# Patient Record
Sex: Male | Born: 1995 | Race: White | Hispanic: No | Marital: Married | Smoking: Never smoker
Health system: Southern US, Community
[De-identification: ages and names within clinical notes are randomized; demographics above are authoritative.]

---
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
## 2016-03-08 ENCOUNTER — Emergency Department (HOSPITAL_COMMUNITY): Payer: BLUE CROSS/BLUE SHIELD

## 2016-03-08 ENCOUNTER — Encounter (HOSPITAL_COMMUNITY): Payer: Self-pay | Admitting: Emergency Medicine

## 2016-03-08 ENCOUNTER — Emergency Department (HOSPITAL_COMMUNITY)
Admission: EM | Admit: 2016-03-08 | Discharge: 2016-03-08 | Disposition: A | Discharge status: Home / Self Care | Payer: BLUE CROSS/BLUE SHIELD | Attending: Emergency Medicine | Admitting: Emergency Medicine

## 2016-03-08 DIAGNOSIS — R05 Cough: Secondary | ICD-10-CM | POA: Diagnosis not present

## 2016-03-08 DIAGNOSIS — J111 Influenza due to unidentified influenza virus with other respiratory manifestations: Secondary | ICD-10-CM

## 2016-03-08 DIAGNOSIS — J029 Acute pharyngitis, unspecified: Secondary | ICD-10-CM | POA: Insufficient documentation

## 2016-03-08 DIAGNOSIS — R112 Nausea with vomiting, unspecified: Secondary | ICD-10-CM | POA: Diagnosis present

## 2016-03-08 DIAGNOSIS — R69 Illness, unspecified: Secondary | ICD-10-CM

## 2016-03-08 LAB — CBC WITH DIFFERENTIAL/PLATELET
BASOS PCT: 0 %
Basophils Absolute: 0 10*3/uL (ref 0.0–0.1)
Eosinophils Absolute: 0 10*3/uL (ref 0.0–0.7)
Eosinophils Relative: 0 %
HEMATOCRIT: 41.3 % (ref 39.0–52.0)
Hemoglobin: 14.5 g/dL (ref 13.0–17.0)
LYMPHS PCT: 7 %
Lymphs Abs: 0.2 10*3/uL — ABNORMAL LOW (ref 0.7–4.0)
MCH: 31.2 pg (ref 26.0–34.0)
MCHC: 35.1 g/dL (ref 30.0–36.0)
MCV: 88.8 fL (ref 78.0–100.0)
MONO ABS: 0.7 10*3/uL (ref 0.1–1.0)
MONOS PCT: 20 %
NEUTROS ABS: 2.6 10*3/uL (ref 1.7–7.7)
Neutrophils Relative %: 73 %
Platelets: 222 10*3/uL (ref 150–400)
RBC: 4.65 MIL/uL (ref 4.22–5.81)
RDW: 12.4 % (ref 11.5–15.5)
WBC: 3.5 10*3/uL — ABNORMAL LOW (ref 4.0–10.5)

## 2016-03-08 LAB — URINALYSIS, ROUTINE W REFLEX MICROSCOPIC
Bilirubin Urine: NEGATIVE
Glucose, UA: NEGATIVE mg/dL
HGB URINE DIPSTICK: NEGATIVE
Ketones, ur: 5 mg/dL — AB
LEUKOCYTES UA: NEGATIVE
NITRITE: NEGATIVE
PROTEIN: 100 mg/dL — AB
Specific Gravity, Urine: 1.032 — ABNORMAL HIGH (ref 1.005–1.030)
pH: 5 (ref 5.0–8.0)

## 2016-03-08 LAB — BASIC METABOLIC PANEL
Anion gap: 10 (ref 5–15)
BUN: 12 mg/dL (ref 6–20)
CHLORIDE: 103 mmol/L (ref 101–111)
CO2: 25 mmol/L (ref 22–32)
CREATININE: 1.11 mg/dL (ref 0.61–1.24)
Calcium: 9.6 mg/dL (ref 8.9–10.3)
GFR calc non Af Amer: >60 mL/min (ref >60)
Glucose, Bld: 105 mg/dL — ABNORMAL HIGH (ref 65–99)
Potassium: 4 mmol/L (ref 3.5–5.1)
Sodium: 138 mmol/L (ref 135–145)

## 2016-03-08 LAB — I-STAT CG4 LACTIC ACID, ED: Lactic Acid, Venous: 0.84 mmol/L (ref 0.5–1.9)

## 2016-03-08 MED ORDER — ONDANSETRON HCL 4 MG/2ML IJ SOLN
4.0000 mg | Freq: Once | INTRAMUSCULAR | Status: AC
  Administered 2016-03-08: 4 mg via INTRAVENOUS
  Filled 2016-03-08: qty 2

## 2016-03-08 MED ORDER — ONDANSETRON HCL 4 MG PO TABS: 4.0000 mg | ORAL_TABLET | Freq: Three times a day (TID) | ORAL | 0 refills | Status: DC | PRN

## 2016-03-08 MED ORDER — ACETAMINOPHEN 325 MG PO TABS: 650.0000 mg | ORAL_TABLET | Freq: Once | ORAL | Status: DC

## 2016-03-08 MED ORDER — HYDROCODONE-ACETAMINOPHEN 5-325 MG PO TABS: ORAL_TABLET | ORAL | 0 refills | Status: DC

## 2016-03-08 MED ORDER — SODIUM CHLORIDE 0.9 % IV BOLUS (SEPSIS)
2000.0000 mL | Freq: Once | INTRAVENOUS | Status: AC
  Administered 2016-03-08: 2000 mL via INTRAVENOUS

## 2016-03-08 MED ORDER — KETOROLAC TROMETHAMINE 30 MG/ML IJ SOLN
30.0000 mg | Freq: Once | INTRAMUSCULAR | Status: AC
  Administered 2016-03-08: 30 mg via INTRAVENOUS
  Filled 2016-03-08: qty 1

## 2016-03-08 NOTE — ED Notes (Signed)
Papers and medications reviewed with patient along with home care instructions. PA was present to review and answer any questions

## 2016-03-08 NOTE — ED Provider Notes (Signed)
[facility]-EMERGENCY DEPT Provider Note   CSN: [id number] Arrival date & time: 03/08/16  0206     History   Chief Complaint Chief Complaint  Patient presents with  . Emesis     HPI   Blood pressure 120/72, pulse 119, temperature 100.3 F (37.9 C), temperature source Oral, resp. rate 19, height 5\' 8"  (1.727 m), weight 68.9 kg, SpO2 100 %.  Bradley Shaffer is a 21 y.o. male was otherwise healthy complaining of flulike symptoms of sore throat, cough, fever, chills, myalgia, headache onset 3 days ago. He started Tamiflu yesterday and then proceeded to have 20 episodes of nonbloody, nonbilious, non-coffee ground emesis. He denies focal abdominal pain, change in bowel or bladder habits, chest pain or shortness of breath.  History reviewed. No pertinent past medical history.  There are no active problems to display for this patient.   History reviewed. No pertinent surgical history.     Home Medications    Prior to Admission medications   Medication Sig Start Date End Date Taking? Authorizing Provider  acetaminophen (TYLENOL) 325 MG tablet Take 1,300 mg by mouth every 6 (six) hours as needed for moderate pain.   Yes Historical Provider, MD  brompheniramine-pseudoephedrine-DM 30-2-10 MG/5ML syrup Take 10 mLs by mouth every 6 (six) hours. 03/07/16  Yes Historical Provider, MD  dimenhyDRINATE (DRAMAMINE) 50 MG tablet Take 150 mg by mouth every 8 (eight) hours as needed for nausea.   Yes Historical Provider, MD  naproxen sodium (ANAPROX) 220 MG tablet Take 440 mg by mouth 2 (two) times daily with a meal.   Yes Historical Provider, MD  oseltamivir (TAMIFLU) 75 MG capsule Take 75 mg by mouth daily. 03/07/16 03/12/16 Yes Historical Provider, MD  HYDROcodone-acetaminophen (NORCO/VICODIN) 5-325 MG tablet Take 1-2 tablets by mouth every 6 hours as needed for pain and/or cough. 03/08/16   Jerline Linzy, PA-C  ondansetron (ZOFRAN) 4 MG tablet Take 1 tablet (4 mg total) by mouth every 8 (eight) hours as  needed for nausea or vomiting. 03/08/16   Joni Reining Mischell Branford, PA-C    Family History No family history on file.  Social History Social History  Substance Use Topics  . Smoking status: Never Smoker  . Smokeless tobacco: Never Used  . Alcohol use No     Allergies   Patient has no known allergies.   Review of Systems Review of Systems  10 systems reviewed and found to be negative, except as noted in the HPI.   Physical Exam Updated Vital Signs BP 113/77   Pulse 85   Temp 99.3 F (37.4 C) (Oral)   Resp 25   Ht 5\' 8"  (1.727 m)   Wt 68.9 kg   SpO2 98%   BMI 23.11 kg/m   Physical Exam  Constitutional: He is oriented to person, place, and time. He appears well-developed and well-nourished. No distress.  Alert, nontoxic appearing  HENT:  Head: Normocephalic and atraumatic.  Mouth/Throat: Oropharynx is clear and moist.  Eyes: Conjunctivae and EOM are normal. Pupils are equal, round, and reactive to light.  Neck: Normal range of motion.  FROM to C-spine. Pt can touch chin to chest without discomfort. No TTP of midline cervical spine.   Cardiovascular: Regular rhythm and intact distal pulses.   Tachycardic with no murmurs, regular rhythm.  Pulmonary/Chest: Effort normal and breath sounds normal. No respiratory distress. He has no wheezes. He has no rales. He exhibits no tenderness.  Abdominal: Soft. There is no tenderness.  Musculoskeletal: Normal range of motion.  Neurological: He is alert and oriented to person, place, and time.  Skin: He is not diaphoretic.  Psychiatric: He has a normal mood and affect.  Nursing note and vitals reviewed.    ED Treatments / Results  Labs (all labs ordered are listed, but only abnormal results are displayed) Labs Reviewed  CBC WITH DIFFERENTIAL/PLATELET - Abnormal; Notable for the following:       Result Value   WBC 3.5 (*)    Lymphs Abs 0.2 (*)    All other components within normal limits  BASIC METABOLIC PANEL - Abnormal;  Notable for the following:    Glucose, Bld 105 (*)    All other components within normal limits  URINALYSIS, ROUTINE W REFLEX MICROSCOPIC - Abnormal; Notable for the following:    APPearance HAZY (*)    Specific Gravity, Urine 1.032 (*)    Ketones, ur 5 (*)    Protein, ur 100 (*)    Bacteria, UA RARE (*)    Squamous Epithelial / LPF 0-5 (*)    All other components within normal limits  CULTURE, BLOOD (ROUTINE X 2)  CULTURE, BLOOD (ROUTINE X 2)  I-STAT CG4 LACTIC ACID, ED  I-STAT CG4 LACTIC ACID, ED    EKG  EKG Interpretation None       Radiology Dg Chest 2 View  Result Date: 03/08/2016 CLINICAL DATA:  Cough, fever, chills, and body aches for the past 3 days. EXAM: CHEST  2 VIEW COMPARISON:  None in PACs FINDINGS: The lungs are adequately inflated and clear. The heart and pulmonary vascularity are normal. The mediastinum is normal in width. The trachea is midline. The bony thorax is unremarkable. IMPRESSION: There is no active cardiopulmonary disease. Electronically Signed   By: David  [place] M.D.   On: 03/08/2016 07:41    Procedures Procedures (including critical care time)  Medications Ordered in ED Medications  sodium chloride 0.9 % bolus 2,000 mL (2,000 mLs Intravenous New Bag/Given 03/08/16 0842)  ondansetron (ZOFRAN) injection 4 mg (4 mg Intravenous Given 03/08/16 0845)  ketorolac (TORADOL) 30 MG/ML injection 30 mg (30 mg Intravenous Given 03/08/16 0846)     Initial Impression / Assessment and Plan / ED Course  I have reviewed the triage vital signs and the nursing notes.  Pertinent labs & imaging results that were available during my care of the patient were reviewed by me and considered in my medical decision making (see chart for details).  Clinical Course as of Mar 08 1020  Tue Mar 08, 2016  0719 Pt not roomed  [NP]  0727 Pt not roomed  [NP]    Clinical Course User Index [NP] Wynetta Emery, [place]    Vitals:   03/08/16 0801 03/08/16 0830 03/08/16 0900  03/08/16 0914  BP: 107/65 113/70 113/77   Pulse: 97 91 85   Resp: 20 25 25    Temp: 99.7 F (37.6 C)   99.3 F (37.4 C)  TempSrc: Oral   Oral  SpO2: 98% 96% 98%   Weight:      Height:        Medications  sodium chloride 0.9 % bolus 2,000 mL (2,000 mLs Intravenous New Bag/Given 03/08/16 0842)  ondansetron (ZOFRAN) injection 4 mg (4 mg Intravenous Given 03/08/16 0845)  ketorolac (TORADOL) 30 MG/ML injection 30 mg (30 mg Intravenous Given 03/08/16 0846)    Justus Memory is 21 y.o. male presenting with  Influenza-like illness and then he started vomiting after initiating Tamiflu likely adverse reaction of the Tamiflu. Abdominal  exam is benign. Blood work with no leukocytosis, he is tachycardic. His lactic acid is normal, likely dehydration. Patient is aggressively hydrated and heart rate has normalized, chest x-rays without infiltrate, repeat abdominal exam is benign, patient is tolerating by mouth's, advised him to DC the Tamiflu and will write a short course of Zofran and Vicodin for comfort at home. Work note is provided.  Evaluation does not show pathology that would require ongoing emergent intervention or inpatient treatment. Pt is hemodynamically stable and mentating appropriately. Discussed findings and plan with patient/guardian, who agrees with care plan. All questions answered. Return precautions discussed and outpatient follow up given.   Final Clinical Impressions(s) / ED Diagnoses   Final diagnoses:  Influenza-like illness  Nausea and vomiting, intractability of vomiting not specified, unspecified vomiting type    New Prescriptions New Prescriptions   HYDROCODONE-ACETAMINOPHEN (NORCO/VICODIN) 5-325 MG TABLET    Take 1-2 tablets by mouth every 6 hours as needed for pain and/or cough.   ONDANSETRON (ZOFRAN) 4 MG TABLET    Take 1 tablet (4 mg total) by mouth every 8 (eight) hours as needed for nausea or vomiting.     Wynetta Emeryicole Delbra Zellars, PA-C 03/08/16 1022    Lavera Guiseana Duo Liu,  MD 03/08/16 1725

## 2016-03-08 NOTE — ED Triage Notes (Signed)
Patient reports emesis with body aches , chills , dry cough  and low grade fever onset yesterday unrelieved by prescription Tamiflu .

## 2016-03-08 NOTE — Discharge Instructions (Signed)
Push fluids: take small frequent sips of water or Gatorade, do not drink any soda, juice or caffeinated beverages.    Slowly resume solid diet as desired. Avoid food that are spicy, contain dairy and/or have high fat content.  Aviod NSAIDs (aspirin, motrin, ibuprofen, naproxen, Aleve et Karie Sodacetera) for pain control because they will irritate your stomach.  For fever control you can take 650 mg of acetaminophen (Tylenol) this is normally 2 over the counter pills; every 4-6 hours, do not take any other medications that have acetaminophen as an ingredient. Do not drink alcohol.  Return to the emergency room for any worsening or concerning symptoms including fast breathing, heart racing, confusion, vomiting.  Rest, cover your mouth when you cough and wash your hands frequently.   Push fluids: water or Gatorade, do not drink any soda, juice or caffeinated beverages.  For fever and pain control you can take Motrin (ibuprofen) as follows: 400 mg (this is normally 2 over the counter pills) every 4 hours with food.  Do not return to work until a day after your fever breaks.   Take Vicodin for cough and pain control, do not drink alcohol, drive, care for children or do other critical tasks while taking Vicodin

## 2016-03-08 NOTE — ED Notes (Signed)
Pt has been drinking ginger ale since arrival.

## 2016-03-13 LAB — CULTURE, BLOOD (ROUTINE X 2)
Culture: NO GROWTH
Culture: NO GROWTH

## 2017-04-03 ENCOUNTER — Emergency Department (HOSPITAL_BASED_OUTPATIENT_CLINIC_OR_DEPARTMENT_OTHER)
Admission: EM | Admit: 2017-04-03 | Discharge: 2017-04-03 | Disposition: A | Discharge status: Home / Self Care | Payer: 59 | Attending: Emergency Medicine | Admitting: Emergency Medicine

## 2017-04-03 ENCOUNTER — Encounter (HOSPITAL_BASED_OUTPATIENT_CLINIC_OR_DEPARTMENT_OTHER): Payer: Self-pay | Admitting: *Deleted

## 2017-04-03 ENCOUNTER — Other Ambulatory Visit: Payer: Self-pay

## 2017-04-03 DIAGNOSIS — R69 Illness, unspecified: Secondary | ICD-10-CM

## 2017-04-03 DIAGNOSIS — R070 Pain in throat: Secondary | ICD-10-CM | POA: Diagnosis present

## 2017-04-03 DIAGNOSIS — J111 Influenza due to unidentified influenza virus with other respiratory manifestations: Secondary | ICD-10-CM | POA: Diagnosis not present

## 2017-04-03 DIAGNOSIS — Z79899 Other long term (current) drug therapy: Secondary | ICD-10-CM | POA: Diagnosis not present

## 2017-04-03 NOTE — ED Triage Notes (Signed)
Pt c/o URi symptoms x 2 days 

## 2017-04-03 NOTE — ED Notes (Signed)
ED Provider at bedside. 

## 2017-04-03 NOTE — ED Provider Notes (Signed)
[facility] EMERGENCY DEPARTMENT Provider Note   CSN: [id number] Arrival date & time: 04/03/17  1255     History   Chief Complaint Chief Complaint  Patient presents with  . URI    HPI Bradley Shaffer is a 22 y.o. male.  Patient with flulike symptoms for 2 days.  Sore throat cough chills body aches onset of symptoms was yesterday.  No nausea has felt somewhat feverish cough usually nonproductive body aches sore throat as mentioned.  Did not have the flu vaccine.  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  No nausea no vomiting.  No diarrhea.  No rash.      History reviewed. No pertinent past medical history.  There are no active problems to display for this patient.   History reviewed. No pertinent surgical history.     Home Medications    Prior to Admission medications   Medication Sig Start Date End Date Taking? Authorizing Provider  acetaminophen (TYLENOL) 325 MG tablet Take 1,300 mg by mouth every 6 (six) hours as needed for moderate pain.    [provider]  brompheniramine-pseudoephedrine-DM 30-2-10 MG/5ML syrup Take 10 mLs by mouth every 6 (six) hours. 03/07/16   [provider]  dimenhyDRINATE (DRAMAMINE) 50 MG tablet Take 150 mg by mouth every 8 (eight) hours as needed for nausea.    [provider]  naproxen sodium (ANAPROX) 220 MG tablet Take 440 mg by mouth 2 (two) times daily with a meal.    [provider]  ondansetron (ZOFRAN) 4 MG tablet Take 1 tablet (4 mg total) by mouth every 8 (eight) hours as needed for nausea or vomiting. 03/08/16   Pisciotta, Mardella LaymanNicole, PA-C    Family History History reviewed. No pertinent family history.  Social History Social History   Tobacco Use  . Smoking status: Never Smoker  . Smokeless tobacco: Never Used  Substance Use Topics  . Alcohol use: No  . Drug use: No     Allergies   Patient has no known allergies.   Review of Systems Review of Systems  Constitutional: Positive for fever.   HENT: Positive for congestion and sore throat.   Eyes: Negative for redness.  Respiratory: Positive for cough. Negative for shortness of breath.   Cardiovascular: Negative for chest pain.  Gastrointestinal: Negative for abdominal pain, diarrhea, nausea and vomiting.  Genitourinary: Negative for dysuria and hematuria.  Musculoskeletal: Positive for myalgias. Negative for neck stiffness.  Skin: Negative for rash.  Neurological: Negative for syncope and headaches.  Hematological: Does not bruise/bleed easily.  Psychiatric/Behavioral: Negative for confusion.     Physical Exam Updated Vital Signs Ht 1.753 m (5\' 9" )   Wt 74.8 kg (165 lb)   BMI 24.37 kg/m   Physical Exam  Constitutional: He is oriented to person, place, and time. He appears well-developed and well-nourished. No distress.  HENT:  Head: Normocephalic and atraumatic.  Mouth/Throat: Oropharynx is clear and moist. No oropharyngeal exudate.  Eyes: Conjunctivae and EOM are normal. Pupils are equal, round, and reactive to light.  Neck: Normal range of motion. Neck supple.  Cardiovascular: Normal rate, regular rhythm and normal heart sounds.  Pulmonary/Chest: Effort normal and breath sounds normal. No respiratory distress.  Abdominal: Soft. Bowel sounds are normal. There is no tenderness.  Musculoskeletal: Normal range of motion. He exhibits no edema.  Lymphadenopathy:    He has no cervical adenopathy.  Neurological: He is alert and oriented to person, place, and time. No cranial nerve deficit or sensory deficit.  He exhibits normal muscle tone. Coordination normal.  Skin: Skin is warm. No rash noted.  Nursing note and vitals reviewed.    ED Treatments / Results  Labs (all labs ordered are listed, but only abnormal results are displayed) Labs Reviewed - No data to display  EKG  EKG Interpretation None       Radiology No results found.  Procedures Procedures (including critical care time)  Medications  Ordered in ED Medications - No data to display   Initial Impression / Assessment and Plan / ED Course  I have reviewed the triage vital signs and the nursing notes.  Pertinent labs & imaging results that were available during my care of the patient were reviewed by me and considered in my medical decision making (see chart for details).    Patient's symptoms flulike in nature.  Posterior pharynx without any erythema or tonsillar exudate or swelling.  Doubt that this is strep throat.  Patient did not have the flu vaccine.  Patient is young enough where mononucleosis is a possibility.  However symptoms just started yesterday so unlikely Monospot test would be positive.  Will treat like it is flu.  Offered Tamiflu patient does not want Tamiflu.  He took that in the past and has made him nauseated.  Patient will be taken out of work for 7 days.  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  Work note provided for 7 days.  Recommending symptomatic treatment Mucinex DM and Naprosyn.   Final Clinical Impressions(s) / ED Diagnoses   Final diagnoses:  Influenza-like illness    ED Discharge Orders    None       Vanetta Mulders, MD 04/04/17 0005

## 2017-04-03 NOTE — Discharge Instructions (Signed)
Symptoms consistent with flulike illness without any complicating factors.  Work note provided.  Return for any new or worse symptoms.  Recommend symptomatic treatment.

## 2018-04-23 IMAGING — DX DG CHEST 2V
2 series · 2 of 2 positions shown · non-contrast
Comparison: None in PACs

CLINICAL DATA: Cough, fever, chills, and body aches for the past 3
days.

EXAM:
CHEST  2 VIEW

[chest pa]
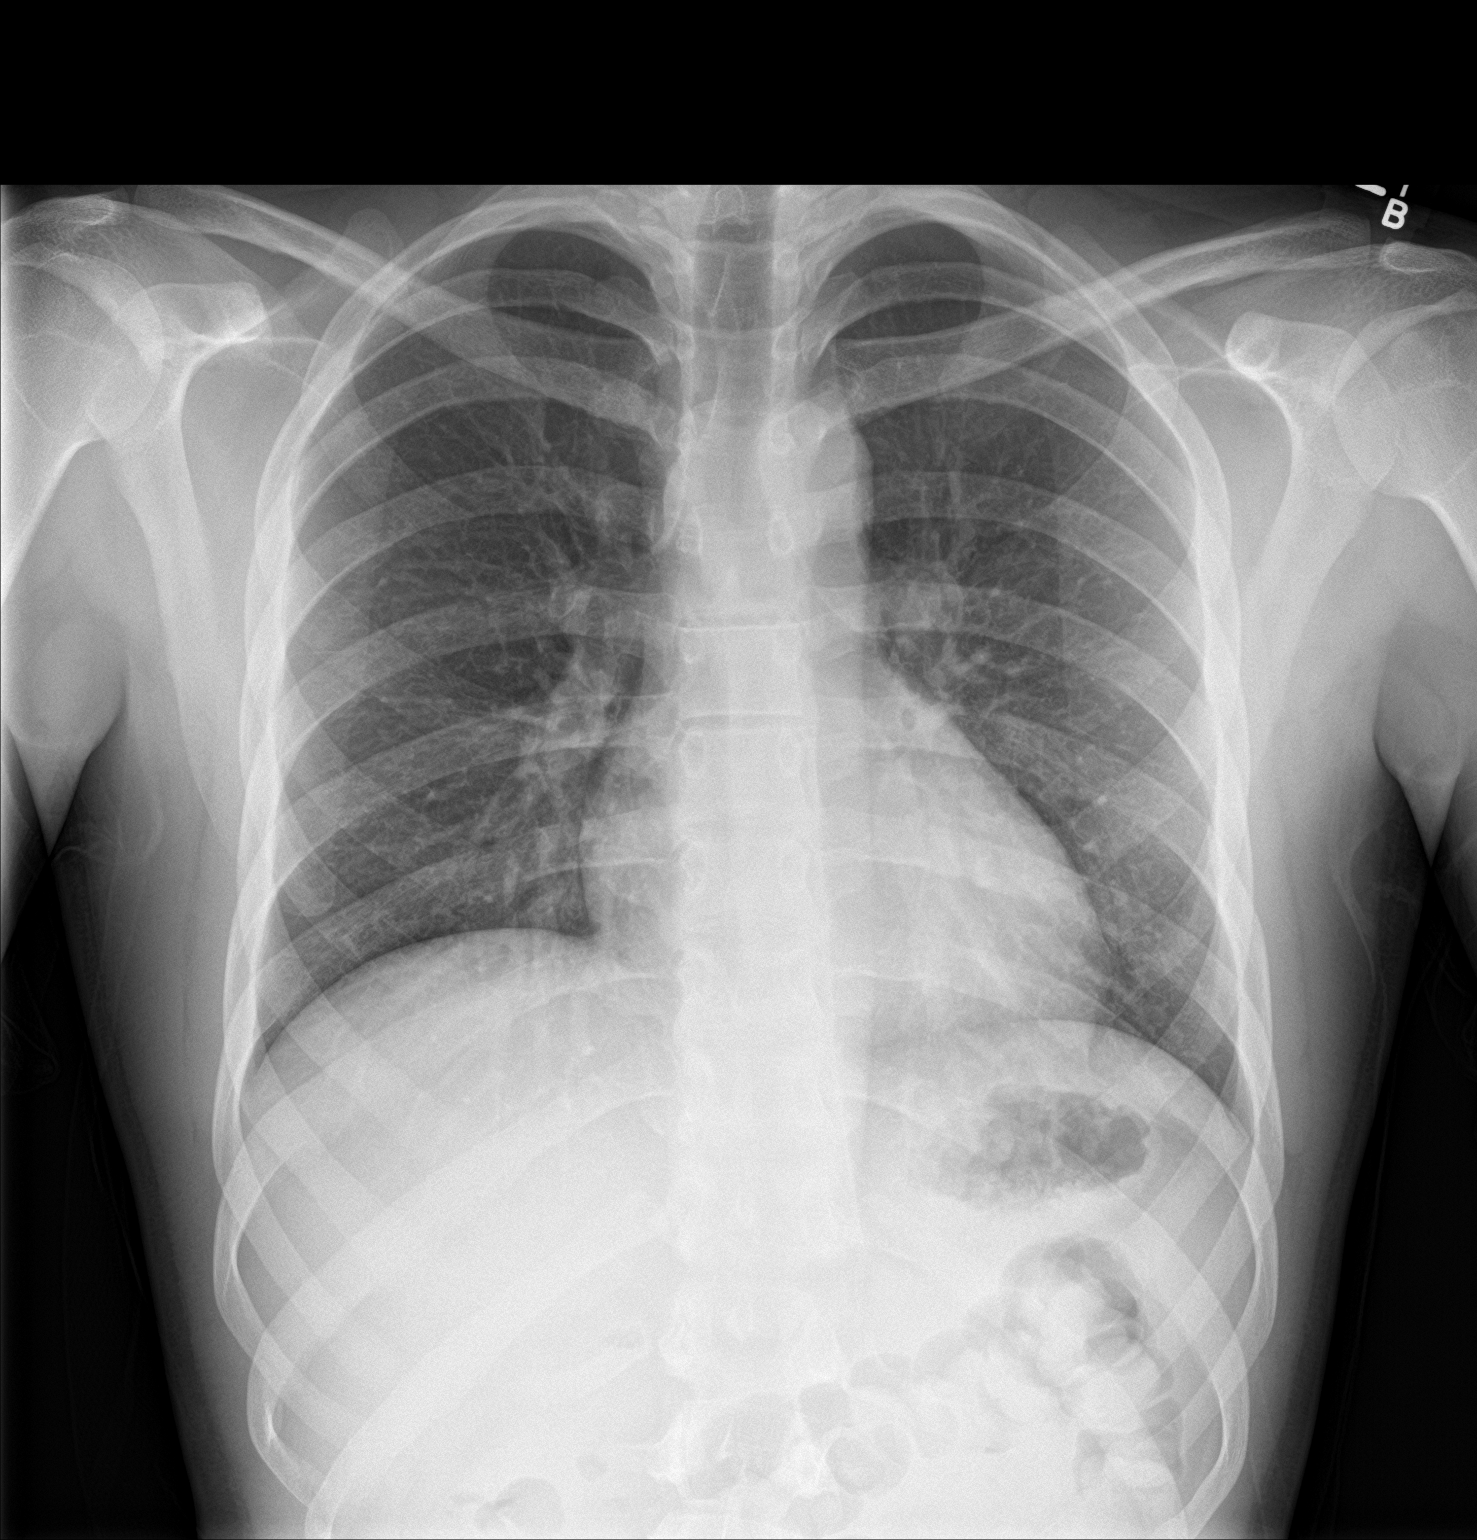

[chest lat]
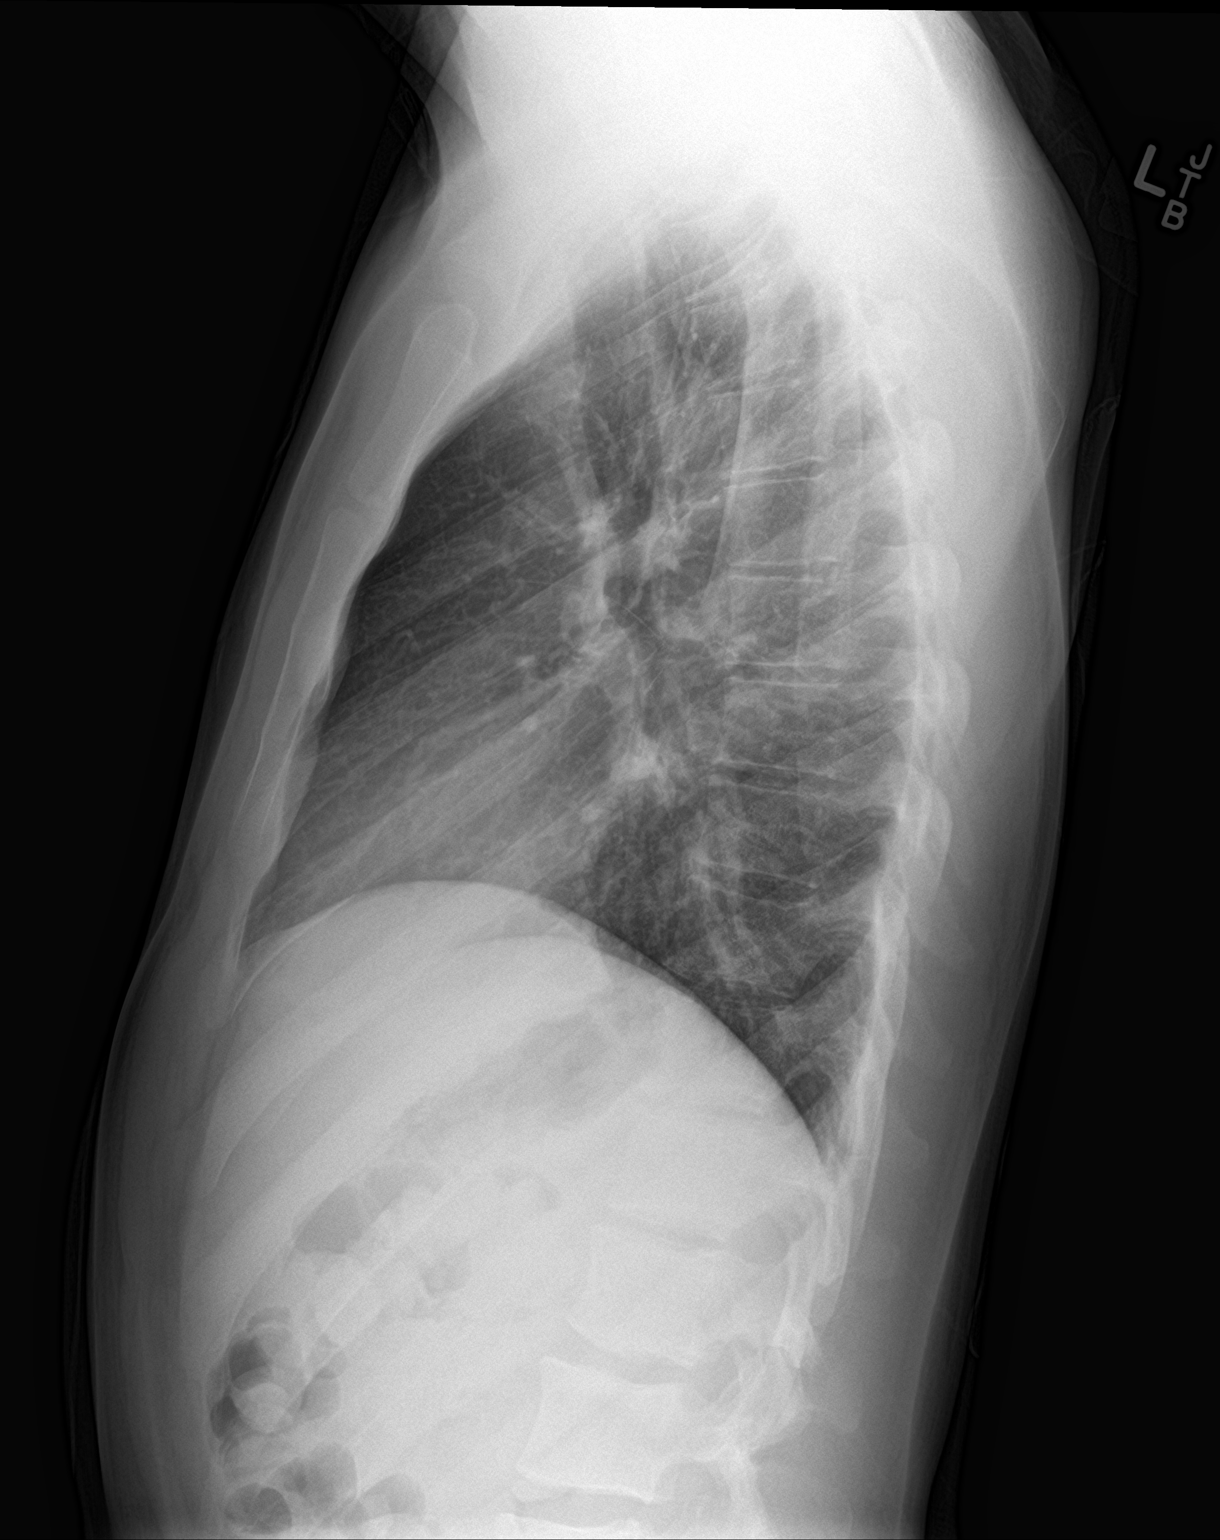

[2 of 2 positions shown; findings below may reference images not displayed]

FINDINGS: The lungs are adequately inflated and clear. The heart and pulmonary
vascularity are normal. The mediastinum is normal in width. The
trachea is midline. The bony thorax is unremarkable.
IMPRESSION: There is no active cardiopulmonary disease.

## 2020-03-18 ENCOUNTER — Telehealth: Payer: Self-pay | Admitting: Sports Medicine

## 2020-03-18 ENCOUNTER — Encounter: Payer: Self-pay | Admitting: Sports Medicine

## 2020-03-18 DIAGNOSIS — U071 COVID-19: Secondary | ICD-10-CM | POA: Insufficient documentation

## 2020-03-18 MED ORDER — PREDNISONE 50 MG PO TABS: ORAL_TABLET | ORAL | 0 refills | Status: DC

## 2020-03-18 NOTE — Telephone Encounter (Signed)
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 Adding 5 days of of prednisone. Adding salt water gargles. He will continue 10 days of quarantine.

## 2020-03-18 NOTE — Assessment & Plan Note (Signed)
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 Adding 5 days of of prednisone. He will continue 10 days of quarantine.

## 2020-07-01 ENCOUNTER — Telehealth: Payer: Self-pay | Admitting: Sports Medicine

## 2020-07-01 DIAGNOSIS — K120 Recurrent oral aphthae: Secondary | ICD-10-CM | POA: Insufficient documentation

## 2020-07-01 MED ORDER — TRIAMCINOLONE ACETONIDE 0.1 % MT PSTE: 1.0000 "application " | PASTE | Freq: Two times a day (BID) | OROMUCOSAL | 11 refills | Status: AC

## 2020-07-01 NOTE — Telephone Encounter (Signed)
Bradley Shaffer has developed what looks to be aphthous ulcers, very painful, difficult to speak and eat.  Tried ibuprofen 800mg PO TID without improvement.  Adding triamcinolone oral paste. 

## 2020-07-01 NOTE — Assessment & Plan Note (Signed)
Bradley Shaffer has developed what looks to be aphthous ulcers, very painful, difficult to speak and eat.  Tried ibuprofen 800mg  PO TID without improvement.  Adding triamcinolone oral paste.

## 2020-10-02 ENCOUNTER — Telehealth: Payer: Self-pay | Admitting: Sports Medicine

## 2020-10-02 DIAGNOSIS — U071 COVID-19: Secondary | ICD-10-CM

## 2020-10-02 MED ORDER — ONDANSETRON 8 MG PO TBDP: 8.0000 mg | ORAL_TABLET | Freq: Three times a day (TID) | ORAL | 3 refills | Status: DC | PRN

## 2020-10-02 MED ORDER — PAXLOVID 20 X 150 MG & 10 X 100MG PO TBPK: 3.0000 | ORAL_TABLET | Freq: Two times a day (BID) | ORAL | 2 refills | Status: AC

## 2020-10-02 NOTE — Telephone Encounter (Signed)
Previously healthy 25 year old male, new onset headache, chills without fever, indigestion, muscle aches and body aches.  He has history of COVID prior, symptoms feel similar. Due to recency of symptom onset we will treat empirically with Paxlovid, he will get over-the-counter testing, he is having some nausea so adding Zofran, he will use over-the-counter cold and flu medications for symptomatic relief in the meantime and follow-up with me in approximately 5 to 10 days.

## 2020-10-02 NOTE — Assessment & Plan Note (Signed)
Previously healthy 24-year-old male, new onset headache, chills without fever, indigestion, muscle aches and body aches.  He has history of COVID prior, symptoms feel similar. Due to recency of symptom onset we will treat empirically with Paxlovid, he will get over-the-counter testing, he is having some nausea so adding Zofran, he will use over-the-counter cold and flu medications for symptomatic relief in the meantime and follow-up with me in approximately 5 to 10 days. 

## 2020-10-27 ENCOUNTER — Other Ambulatory Visit: Payer: Self-pay

## 2020-10-27 ENCOUNTER — Encounter: Payer: Self-pay | Admitting: Sports Medicine

## 2020-10-27 ENCOUNTER — Ambulatory Visit (INDEPENDENT_AMBULATORY_CARE_PROVIDER_SITE_OTHER): Payer: 59 | Admitting: Sports Medicine

## 2020-10-27 DIAGNOSIS — Z0000 Encounter for general adult medical examination without abnormal findings: Secondary | ICD-10-CM | POA: Insufficient documentation

## 2020-10-27 DIAGNOSIS — H9190 Unspecified hearing loss, unspecified ear: Secondary | ICD-10-CM | POA: Insufficient documentation

## 2020-10-27 DIAGNOSIS — H9193 Unspecified hearing loss, bilateral: Secondary | ICD-10-CM

## 2020-10-27 NOTE — Assessment & Plan Note (Signed)
Erma will return at some point for annual physical, we will discuss later routine labs.

## 2020-10-27 NOTE — Assessment & Plan Note (Addendum)
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 Exam is unremarkable, normal Rinne and Weber tests, normal inspection of the external canals, he does have a bit of sclerosis of his tympanic membranes bilaterally. Hearing test does show loss of hearing in all frequencies below 25 dB. This is likely due to prolonged exposure to loud sounds, my advice to him is to protect his hearing while on the ramp, and try turning off active noise canceling what is headsets to see if that helps. I do not think symptomatology is enough at this juncture to warrant hearing aids.

## 2020-10-27 NOTE — Progress Notes (Addendum)
    Procedures performed today:    None.  Independent interpretation of notes and tests performed by another provider:   None.  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 Exam is unremarkable, normal Rinne and Weber tests, normal inspection of the external canals, he does have a bit of sclerosis of his tympanic membranes bilaterally. Hearing test does show loss of hearing in all frequencies below 25 dB. This is likely due to prolonged exposure to loud sounds, my advice to him is to protect his hearing while on the ramp, and try turning off active noise canceling what is headsets to see if that helps. I do not think symptomatology is enough at this juncture to warrant hearing aids.  Annual physical exam Donn will return at some point for annual physical, we will discuss later routine labs.    ___________________________________________ Ihor Johney. Benjamin Stain, M.D., ABFM., CAQSM. Primary Care and Sports Medicine [facility]  Adjunct Instructor of Family Medicine  [employer]

## 2020-11-28 ENCOUNTER — Encounter: Payer: Self-pay | Admitting: Sports Medicine

## 2021-01-12 ENCOUNTER — Telehealth: Payer: Self-pay | Admitting: Sports Medicine

## 2021-01-12 DIAGNOSIS — H00019 Hordeolum externum unspecified eye, unspecified eyelid: Secondary | ICD-10-CM | POA: Insufficient documentation

## 2021-01-12 DIAGNOSIS — H00015 Hordeolum externum left lower eyelid: Secondary | ICD-10-CM

## 2021-01-12 MED ORDER — ERYTHROMYCIN 5 MG/GM OP OINT: 1.0000 "application " | TOPICAL_OINTMENT | Freq: Three times a day (TID) | OPHTHALMIC | 0 refills | Status: AC

## 2021-01-12 NOTE — Telephone Encounter (Signed)
Stye, left lower eyelid Adding topical erythromycin, patient understands not to fly within an hour or 2 of applying the antibiotic.

## 2021-01-12 NOTE — Assessment & Plan Note (Signed)
Adding topical erythromycin, patient understands not to fly within an hour or 2 of applying the antibiotic.

## 2021-02-15 ENCOUNTER — Telehealth: Payer: Self-pay | Admitting: Sports Medicine

## 2021-02-15 DIAGNOSIS — U071 COVID-19: Secondary | ICD-10-CM

## 2021-02-15 DIAGNOSIS — J111 Influenza due to unidentified influenza virus with other respiratory manifestations: Secondary | ICD-10-CM

## 2021-02-15 DIAGNOSIS — T391X1A Poisoning by 4-Aminophenol derivatives, accidental (unintentional), initial encounter: Secondary | ICD-10-CM

## 2021-02-15 DIAGNOSIS — J209 Acute bronchitis, unspecified: Secondary | ICD-10-CM | POA: Insufficient documentation

## 2021-02-15 MED ORDER — ONDANSETRON 8 MG PO TBDP: 8.0000 mg | ORAL_TABLET | Freq: Three times a day (TID) | ORAL | 3 refills | Status: DC | PRN

## 2021-02-15 MED ORDER — XOFLUZA (80 MG DOSE) 1 X 80 MG PO TBPK: 1.0000 | ORAL_TABLET | Freq: Once | ORAL | 0 refills | Status: AC

## 2021-02-15 NOTE — Telephone Encounter (Deleted)
Influenza-like illness Bradley Shaffer has had about a day or 2 of muscle aches, body aches, chills, wife is sick as well, he had excessive nausea with Tamiflu, I think I have a Xofluza sample in my drawer that I will leave at the front for him to pick up.  Symptomatic management until then.

## 2021-02-15 NOTE — Assessment & Plan Note (Signed)
Bradley Shaffer has had about a day or 2 of muscle aches, body aches, chills, wife is sick as well, he had excessive nausea with Tamiflu, I think I have a Xofluza sample in my drawer that I will leave at the front for him to pick up.  Symptomatic management until then.

## 2021-02-16 DIAGNOSIS — T391X1A Poisoning by 4-Aminophenol derivatives, accidental (unintentional), initial encounter: Secondary | ICD-10-CM | POA: Insufficient documentation

## 2021-02-16 NOTE — Addendum Note (Signed)
Addended by: Monica Becton on: 02/16/2021 08:31 AM   Modules accepted: Orders

## 2021-02-16 NOTE — Addendum Note (Signed)
Addended by: Monica Becton on: 02/16/2021 08:49 AM   Modules accepted: Orders

## 2021-02-16 NOTE — Telephone Encounter (Signed)
Influenza-like illness Bradley Shaffer has had about a day or 2 of muscle aches, body aches, chills, wife is sick as well, he had excessive nausea with Tamiflu, I think I have a Xofluza sample in my drawer that I will leave at the front for him to pick up.  Symptomatic management until then.  Acetaminophen overdose, accidental or unintentional, initial encounter Bradley Shaffer calls in, he has been taking acetaminophen and cold and flu medications for flulike symptoms, last dose about 4 AM today, notes that he accidentally took about 6000 total milligrams over a 12-hour period of time.  Informed not to take anymore, he is having some nausea and vomiting, we do need some labs, these will be obtained stat today, CBC, CMP, acetaminophen levels, coags.

## 2021-02-16 NOTE — Assessment & Plan Note (Signed)
Bradley Shaffer calls in, he has been taking acetaminophen and cold and flu medications for flulike symptoms, last dose about 4 AM today, notes that he accidentally took about 6000 total milligrams over a 12-hour period of time.  Informed not to take anymore, he is having some nausea and vomiting, we do need some labs, these will be obtained stat today, CBC, CMP, acetaminophen levels, coags.

## 2021-02-18 LAB — ACETAMINOPHEN LEVEL: Acetaminophen (Tylenol), Serum: 16 mg/L (ref 10–20)

## 2021-02-18 LAB — COMPREHENSIVE METABOLIC PANEL
AG Ratio: 1.7 (calc) (ref 1.0–2.5)
ALT: 53 U/L — ABNORMAL HIGH (ref 9–46)
AST: 34 U/L (ref 10–40)
Albumin: 4.8 g/dL (ref 3.6–5.1)
Alkaline phosphatase (APISO): 45 U/L (ref 36–130)
BUN: 14 mg/dL (ref 7–25)
CO2: 27 mmol/L (ref 20–32)
Calcium: 9.8 mg/dL (ref 8.6–10.3)
Chloride: 102 mmol/L (ref 98–110)
Creat: 1.01 mg/dL (ref 0.60–1.24)
Globulin: 2.9 g/dL (calc) (ref 1.9–3.7)
Glucose, Bld: 103 mg/dL — ABNORMAL HIGH (ref 65–99)
Potassium: 4.9 mmol/L (ref 3.5–5.3)
Sodium: 136 mmol/L (ref 135–146)
Total Bilirubin: 0.4 mg/dL (ref 0.2–1.2)
Total Protein: 7.7 g/dL (ref 6.1–8.1)

## 2021-02-18 LAB — CBC WITH DIFFERENTIAL/PLATELET
Absolute Monocytes: 1067 cells/uL — ABNORMAL HIGH (ref 200–950)
Basophils Absolute: 32 cells/uL (ref 0–200)
Basophils Relative: 0.7 %
Eosinophils Absolute: 101 cells/uL (ref 15–500)
Eosinophils Relative: 2.2 %
HCT: 44.4 % (ref 38.5–50.0)
Hemoglobin: 15.1 g/dL (ref 13.2–17.1)
Lymphs Abs: 405 cells/uL — ABNORMAL LOW (ref 850–3900)
MCH: 30.8 pg (ref 27.0–33.0)
MCHC: 34 g/dL (ref 32.0–36.0)
MCV: 90.6 fL (ref 80.0–100.0)
MPV: 10.4 fL (ref 7.5–12.5)
Monocytes Relative: 23.2 %
Neutro Abs: 2995 cells/uL (ref 1500–7800)
Neutrophils Relative %: 65.1 %
Platelets: 320 10*3/uL (ref 140–400)
RBC: 4.9 10*6/uL (ref 4.20–5.80)
RDW: 12 % (ref 11.0–15.0)
Total Lymphocyte: 8.8 %
WBC: 4.6 10*3/uL (ref 3.8–10.8)

## 2021-02-18 LAB — PROTIME-INR
INR: 1
Prothrombin Time: 10.3 s (ref 9.0–11.5)

## 2021-03-29 ENCOUNTER — Telehealth: Payer: Self-pay | Admitting: Sports Medicine

## 2021-03-29 DIAGNOSIS — H00014 Hordeolum externum left upper eyelid: Secondary | ICD-10-CM

## 2021-03-29 MED ORDER — DOXYCYCLINE HYCLATE 100 MG PO TABS: 100.0000 mg | ORAL_TABLET | Freq: Two times a day (BID) | ORAL | 0 refills | Status: AC

## 2021-03-29 MED ORDER — ERYTHROMYCIN 5 MG/GM OP OINT: 1.0000 "application " | TOPICAL_OINTMENT | Freq: Three times a day (TID) | OPHTHALMIC | 0 refills | Status: AC

## 2021-03-29 NOTE — Telephone Encounter (Signed)
Stye, left upper eyelid Pleasant 26 year old male calls in, complaining of a new stye left upper eyelid, adding topical erythromycin and oral doxycycline, warm compresses, return as needed.

## 2021-03-29 NOTE — Assessment & Plan Note (Addendum)
Pleasant 26 year old male calls in, complaining of a new stye left upper eyelid, adding topical erythromycin and oral doxycycline, warm compresses, return as needed.

## 2021-12-29 ENCOUNTER — Encounter: Payer: Self-pay | Admitting: Sports Medicine

## 2021-12-29 DIAGNOSIS — J209 Acute bronchitis, unspecified: Secondary | ICD-10-CM

## 2021-12-29 MED ORDER — BENZONATATE 200 MG PO CAPS: 200.0000 mg | ORAL_CAPSULE | Freq: Three times a day (TID) | ORAL | 0 refills | Status: DC | PRN

## 2021-12-29 MED ORDER — HYDROCOD POLI-CHLORPHE POLI ER 10-8 MG/5ML PO SUER: 5.0000 mL | Freq: Two times a day (BID) | ORAL | 0 refills | Status: DC | PRN

## 2021-12-29 MED ORDER — PREDNISONE 50 MG PO TABS: 50.0000 mg | ORAL_TABLET | Freq: Every day | ORAL | 0 refills | Status: DC

## 2021-12-29 NOTE — Assessment & Plan Note (Signed)
4 days of cough, chest tightness, sore throat and congestion. Adding prednisone, Tessalon Perles, Tussionex. Cautioned against flying after using Tussionex. Let me know if not better after a week and a half and we can add antibiotics.

## 2022-07-29 ENCOUNTER — Emergency Department (HOSPITAL_BASED_OUTPATIENT_CLINIC_OR_DEPARTMENT_OTHER)
Admission: EM | Admit: 2022-07-29 | Discharge: 2022-07-29 | Disposition: A | Discharge status: Home / Self Care | Payer: No Typology Code available for payment source | Attending: Emergency Medicine | Admitting: Emergency Medicine

## 2022-07-29 ENCOUNTER — Other Ambulatory Visit (HOSPITAL_BASED_OUTPATIENT_CLINIC_OR_DEPARTMENT_OTHER): Payer: Self-pay

## 2022-07-29 ENCOUNTER — Other Ambulatory Visit: Payer: Self-pay

## 2022-07-29 ENCOUNTER — Emergency Department (HOSPITAL_BASED_OUTPATIENT_CLINIC_OR_DEPARTMENT_OTHER): Payer: No Typology Code available for payment source | Admitting: Radiology

## 2022-07-29 ENCOUNTER — Encounter (HOSPITAL_BASED_OUTPATIENT_CLINIC_OR_DEPARTMENT_OTHER): Payer: Self-pay | Admitting: Emergency Medicine

## 2022-07-29 DIAGNOSIS — R0789 Other chest pain: Secondary | ICD-10-CM

## 2022-07-29 DIAGNOSIS — M25512 Pain in left shoulder: Secondary | ICD-10-CM | POA: Diagnosis not present

## 2022-07-29 LAB — BASIC METABOLIC PANEL
Anion gap: 9 (ref 5–15)
BUN: 15 mg/dL (ref 6–20)
CO2: 26 mmol/L (ref 22–32)
Calcium: 9.6 mg/dL (ref 8.9–10.3)
Chloride: 104 mmol/L (ref 98–111)
Creatinine, Ser: 1.05 mg/dL (ref 0.61–1.24)
GFR, Estimated: >60 mL/min (ref >60)
Glucose, Bld: 98 mg/dL (ref 70–99)
Potassium: 3.9 mmol/L (ref 3.5–5.1)
Sodium: 139 mmol/L (ref 135–145)

## 2022-07-29 LAB — CBC
HCT: 43.6 % (ref 39.0–52.0)
Hemoglobin: 15 g/dL (ref 13.0–17.0)
MCH: 31.4 pg (ref 26.0–34.0)
MCHC: 34.4 g/dL (ref 30.0–36.0)
MCV: 91.2 fL (ref 80.0–100.0)
Platelets: 297 10*3/uL (ref 150–400)
RBC: 4.78 MIL/uL (ref 4.22–5.81)
RDW: 12.5 % (ref 11.5–15.5)
WBC: 4.2 10*3/uL (ref 4.0–10.5)
nRBC: 0 % (ref 0.0–0.2)

## 2022-07-29 LAB — D-DIMER, QUANTITATIVE: D-Dimer, Quant: <0.27 ug/mL-FEU (ref 0.00–0.50)

## 2022-07-29 LAB — TROPONIN I (HIGH SENSITIVITY): Troponin I (High Sensitivity): <2 ng/L (ref <18)

## 2022-07-29 MED ORDER — KETOROLAC TROMETHAMINE 15 MG/ML IJ SOLN
15.0000 mg | Freq: Once | INTRAMUSCULAR | Status: AC
  Administered 2022-07-29: 15 mg via INTRAMUSCULAR
  Filled 2022-07-29: qty 1

## 2022-07-29 NOTE — ED Provider Notes (Signed)
[place] EMERGENCY DEPARTMENT AT [facility] Provider Note   CSN: [id number] Arrival date & time: 07/29/22  0830     History  Chief Complaint  Patient presents with   Chest Pain    Bradley Shaffer is a 27 y.o. male who presents to the ED with concerns for left sided chest wall pain onset PTA.  He notes that he was sitting when he noticed a stabbing sensation to the left-sided chest that is nonradiating.  However also notes pain to his left shoulder and left leg.  Denies recent injury, trauma, fall.  Patient notes that he did move yesterday and was lifting heavy items prior to onset of his symptoms.  Also notes that he flew from [place] a week ago.  His chest pain has resolved at this time and lasted for 25-30 minutes.  Denies history of similar symptoms.  Has associated palpitations.  No meds tried prior to arrival.  Denies fever, diaphoresis, shortness of breath, leg swelling, abdominal pain, nausea, vomiting. Denies PMHx of MI, DM, HTN, CAD, family history of MI in someone younger than age 10, stents. Pt denies recent travel, immobilization, surgery, estrogen use, birth control use, or PMHx of PE/DVT.     The history is provided by the patient. No language interpreter was used.       Home Medications Prior to Admission medications   Medication Sig Start Date End Date Taking? Authorizing Provider  benzonatate (TESSALON) 200 MG capsule Take 1 capsule (200 mg total) by mouth 3 (three) times daily as needed for cough. 12/29/21   Monica Becton, MD  chlorpheniramine-HYDROcodone (TUSSIONEX) 10-8 MG/5ML Take 5 mLs by mouth every 12 (twelve) hours as needed for cough (cough, will cause drowsiness.). 12/29/21   Monica Becton, MD  ondansetron (ZOFRAN-ODT) 8 MG disintegrating tablet Take 1 tablet (8 mg total) by mouth every 8 (eight) hours as needed for nausea. 02/15/21   Monica Becton, MD  predniSONE (DELTASONE) 50 MG tablet Take 1 tablet (50 mg total) by mouth  daily. 12/29/21   Monica Becton, MD  triamcinolone (KENALOG) 0.1 % paste Use as directed 1 application in the mouth or throat 2 (two) times daily. 07/01/20   Monica Becton, MD      Allergies    Tamiflu [oseltamivir]    Review of Systems   Review of Systems  Constitutional:  Negative for diaphoresis and fever.  Respiratory:  Negative for shortness of breath.   Cardiovascular:  Positive for chest pain and palpitations. Negative for leg swelling.  Gastrointestinal:  Negative for abdominal pain, nausea and vomiting.  All other systems reviewed and are negative.   Physical Exam Updated Vital Signs BP (!) 133/98   Pulse 78   Temp 97.7 F (36.5 C)   Resp (!) 25   Ht 5\' 9"  (1.753 m)   Wt 83.9 kg   SpO2 99%   BMI 27.32 kg/m  Physical Exam Vitals and nursing note reviewed.  Constitutional:      General: He is not in acute distress.    Appearance: Normal appearance.  Eyes:     General: No scleral icterus.    Extraocular Movements: Extraocular movements intact.  Cardiovascular:     Rate and Rhythm: Normal rate.  Pulmonary:     Effort: Pulmonary effort is normal. No respiratory distress.  Chest:     Chest wall: No tenderness.     Comments: No chest wall tenderness to palpation. Abdominal:     Palpations: Abdomen  is soft. There is no mass.     Tenderness: There is no abdominal tenderness.  Musculoskeletal:        General: Normal range of motion.     Cervical back: Neck supple.     Comments: No spinal tenderness to palpation.  Tenderness to palpation noted to left anterior shoulder without overlying skin changes or palpable deformity noted.  Full range of motion of left shoulder.  Skin:    General: Skin is warm and dry.     Findings: No rash.  Neurological:     Mental Status: He is alert.     Sensory: Sensation is intact.     Motor: Motor function is intact.  Psychiatric:        Behavior: Behavior normal.     ED Results / Procedures / Treatments    Labs (all labs ordered are listed, but only abnormal results are displayed) Labs Reviewed  BASIC METABOLIC PANEL  CBC  D-DIMER, QUANTITATIVE  TROPONIN I (HIGH SENSITIVITY)    EKG EKG Interpretation  Date/Time:  Friday Jul 29 2022 08:40:06 EDT Ventricular Rate:  76 PR Interval:  162 QRS Duration: 95 QT Interval:  372 QTC Calculation: 419 R Axis:   61 Text Interpretation: Sinus rhythm ST elev, probable normal early repol pattern No previous tracing Confirmed by Gwyneth Sprout ([id number]) on 07/29/2022 9:39:44 AM  Radiology DG Chest 2 View  Result Date: 07/29/2022 CLINICAL DATA:  27 year old male with acute chest pain. EXAM: CHEST - 2 VIEW COMPARISON:  Chest radiographs 03/08/2016. FINDINGS: Lower lung volumes on PA and lateral views at 0857 hours today. Mild lower lung crowding. No pneumothorax, pulmonary edema, pleural effusion or confluent lung opacity. Mediastinal contours remain normal. Visualized tracheal air column is within normal limits. Negative visible bowel gas and osseous structures. IMPRESSION: Lower lung volumes.  No acute cardiopulmonary abnormality. Electronically Signed   By: Odessa Fleming M.D.   On: 07/29/2022 09:03    Procedures Procedures    Medications Ordered in ED Medications  ketorolac (TORADOL) 15 MG/ML injection 15 mg (15 mg Intramuscular Given 07/29/22 1152)    ED Course/ Medical Decision Making/ A&P Clinical Course as of 07/29/22 1635  Fri Jul 29, 2022  1110 Pt re-evaluated and discussed with patient lab and imaging findings. Pt continues without chest pain at this time. [SB]  1221 Re-evaluated and noted improvement of symptoms with treatment regimen. Offered to give patient information for cardiology follow up, patient declines at this time. Discussed discharge treatment plan. Pt agreeable at this time. Pt appears safe for discharge. [SB]    Clinical Course User Index [SB] Jernard Reiber A, PA-C                             Medical Decision  Making Amount and/or Complexity of Data Reviewed Labs: ordered. Radiology: ordered.  Risk Prescription drug management.   Patient presents to the ED with resolved left sided chest pain onset PTA. No prior history of MI, catheterization, DVT/PE. Vital signs pt afebrile, patient not hypoxic or tachycardic. On exam patient with no chest wall TTP. No TTP noted to spine or musculature of back. TTP noted to left anterior shoulder without overlying skin changes or palpable deformity noted. FROM of left shoulder. Otherwise, no acute cardiovascular, respiratory, abdominal findings. Differential diagnosis includes ACS, aortic dissection, pneumothorax, PE, PNA.     Labs:  I ordered, and personally interpreted labs.  The pertinent results include:   Troponin <  2 Dimer <0.27 CBC and BMP unremarkable  Imaging: I ordered imaging studies including CXR I independently visualized and interpreted imaging which showed: no acute findings I agree with the radiologist interpretation  Medications:  I ordered medication including toradol for symptom management Reevaluation of the patient after these medicines and interventions, I reevaluated the patient and found that they have improved I have reviewed the patients home medicines and have made adjustments as needed   Disposition: Presentation suspicious for chest wall pain. Doubt concerns at this time for ACS, troponin negative. Chest x-ray without acute findings, vital signs stable, doubt aortic dissection or pneumothorax at this time. Dimer negative. Doubt PNA or PTX at this time.  Heart score, patient at low risk. After consideration of the diagnostic results and the patients response to treatment, I feel that the patient would benefit from Discharge home. Supportive care measures and strict return precautions discussed with patient at bedside. Pt acknowledges and verbalizes understanding. Pt appears safe for discharge. Follow up as indicated in discharge  paperwork.   This chart was dictated using voice recognition software, Dragon. Despite the best efforts of this provider to proofread and correct errors, errors may still occur which can change documentation meaning.   Final Clinical Impression(s) / ED Diagnoses Final diagnoses:  Chest wall pain  Acute pain of left shoulder    Rx / DC Orders ED Discharge Orders     None         Sven Pinheiro A, PA-C 07/29/22 1636    Gwyneth Sprout, MD 07/30/22 2042

## 2022-07-29 NOTE — ED Notes (Signed)
Patient transported to X-ray 

## 2022-07-29 NOTE — Discharge Instructions (Signed)
It was a pleasure taking care of you today!   Your workup was negative in the ED. You may apply ice or heat to the affected area for 15 minutes at a time.  Ensure to place a barrier between your skin and and the ice.  You may use over-the-counter 500 mg Tylenol every 6 hours and alternate with 600 mg ibuprofen every 6 hours as needed for pain.  Follow-up with your primary care provider for evaluation of your symptoms. You may return to the ED if you are experiencing increasing/worsening chest pain, shortness of breath, or worsening symptoms.

## 2022-07-29 NOTE — ED Triage Notes (Signed)
Pt arrives pov, steady gait with c/o acute LT side CP with radiation to LT arm and shoulder and jaw 30 mins pta. Also reports LT leg pain. Denies n/v, endorses dizziness

## 2022-09-23 ENCOUNTER — Telehealth: Payer: Self-pay | Admitting: Sports Medicine

## 2022-09-23 DIAGNOSIS — M79644 Pain in right finger(s): Secondary | ICD-10-CM | POA: Insufficient documentation

## 2022-09-23 NOTE — Telephone Encounter (Signed)
Patient calls with acute right thumb pain, will get XR.

## 2022-09-26 ENCOUNTER — Ambulatory Visit: Payer: No Typology Code available for payment source

## 2022-09-26 DIAGNOSIS — M79644 Pain in right finger(s): Secondary | ICD-10-CM

## 2022-12-16 ENCOUNTER — Encounter: Payer: Self-pay | Admitting: Sports Medicine

## 2022-12-16 ENCOUNTER — Ambulatory Visit (INDEPENDENT_AMBULATORY_CARE_PROVIDER_SITE_OTHER): Payer: Self-pay | Admitting: Sports Medicine

## 2022-12-16 VITALS — BP 121/76 | HR 69 | Ht 69.0 in | Wt 180.0 lb

## 2022-12-16 DIAGNOSIS — Z0289 Encounter for other administrative examinations: Secondary | ICD-10-CM

## 2022-12-16 LAB — POCT URINALYSIS DIP (CLINITEK)
Bilirubin, UA: NEGATIVE
Blood, UA: NEGATIVE
Glucose, UA: NEGATIVE mg/dL
Ketones, POC UA: NEGATIVE mg/dL
Leukocytes, UA: NEGATIVE
Nitrite, UA: NEGATIVE
POC PROTEIN,UA: NEGATIVE
Spec Grav, UA: 1.02 (ref 1.010–1.025)
Urobilinogen, UA: 0.2 U/dL
pH, UA: 7 (ref 5.0–8.0)

## 2022-12-16 NOTE — Assessment & Plan Note (Signed)
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 He will do it yearly until age 27, we will need ECGs starting age 22.

## 2022-12-16 NOTE — Progress Notes (Signed)
    Procedures performed today:    None.  Independent interpretation of notes and tests performed by another provider:   None.  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 He will do it yearly until age 27, we will need ECGs starting age 32.    ____________________________________________ Ihor Osmel. Benjamin Stain, M.D., ABFM., CAQSM., AME. Primary Care and Sports Medicine [facility]  Adjunct Professor of Family Medicine  [employer]  Restaurant manager, fast food

## 2023-02-24 ENCOUNTER — Ambulatory Visit: Payer: Self-pay | Admitting: Sports Medicine

## 2023-03-14 ENCOUNTER — Ambulatory Visit (INDEPENDENT_AMBULATORY_CARE_PROVIDER_SITE_OTHER): Payer: 59 | Admitting: Sports Medicine

## 2023-03-14 VITALS — BP 118/72 | HR 67 | Temp 98.0°F

## 2023-03-14 DIAGNOSIS — J209 Acute bronchitis, unspecified: Secondary | ICD-10-CM | POA: Diagnosis not present

## 2023-03-14 MED ORDER — AZITHROMYCIN 250 MG PO TABS
ORAL_TABLET | ORAL | 0 refills | Status: AC
Start: 2023-03-14 — End: ?

## 2023-03-14 MED ORDER — PREDNISONE 50 MG PO TABS
50.0000 mg | ORAL_TABLET | Freq: Every day | ORAL | 0 refills | Status: AC
Start: 2023-03-14 — End: ?

## 2023-03-14 NOTE — Progress Notes (Signed)
    Procedures performed today:    None.  Independent interpretation of notes and tests performed by another provider:   None.  Brief History, Exam, Impression, and Recommendations:    Acute bronchitis Pleasant and previously healthy 28 year old male, he has had about 3 weeks of facial pressure, mucus production with rhinorrhea and productive cough. Feelings of malaise, otherwise no fevers, chills, muscle aches, body aches, nausea, vomiting, or diarrhea. He has tried DayQuil and Mucinex DM for 3 weeks now without improvement. On exam he appears unwell, he does have some coarse sounds bilateral lung bases, adding a chest x-ray, prednisone , azithromycin , return to see me if not better in a couple of weeks. Of note we last treated this in 2023.    ____________________________________________ Debby PARAS. Curtis, M.D., ABFM., CAQSM., AME. Primary Care and Sports Medicine  [facility]  Adjunct Professor of [facility] Medicine  [employer]  Restaurant Manager, [employer]

## 2023-03-14 NOTE — Assessment & Plan Note (Addendum)
 Pleasant and previously healthy 28 year old male, he has had about 3 weeks of facial pressure, mucus production with rhinorrhea and productive cough. Feelings of malaise, otherwise no fevers, chills, muscle aches, body aches, nausea, vomiting, or diarrhea. He has tried DayQuil and Mucinex DM for 3 weeks now without improvement. On exam he appears unwell, he does have some coarse sounds bilateral lung bases, adding a chest x-ray, prednisone , azithromycin , return to see me if not better in a couple of weeks. Of note we last treated this in 2023.

## 2023-03-15 LAB — POCT INFLUENZA A/B
Influenza A, POC: NEGATIVE
Influenza B, POC: NEGATIVE

## 2023-03-15 NOTE — Addendum Note (Signed)
 Addended by: Arvel Lather A on: 03/15/2023 08:40 AM   Modules accepted: Orders

## 2023-10-31 ENCOUNTER — Encounter: Payer: Self-pay | Admitting: Sports Medicine
